# Patient Record
Sex: Female | Born: 1945 | Hispanic: No | Marital: Single | Smoking: Never smoker
Health system: Southern US, Community
[De-identification: ages and names within clinical notes are randomized; demographics above are authoritative.]

## PROBLEM LIST (undated history)

## (undated) DIAGNOSIS — J45909 Unspecified asthma, uncomplicated: Secondary | ICD-10-CM

## (undated) DIAGNOSIS — I10 Essential (primary) hypertension: Secondary | ICD-10-CM

## (undated) HISTORY — PX: JOINT REPLACEMENT: SHX530

---

## 2021-03-10 ENCOUNTER — Emergency Department (HOSPITAL_COMMUNITY)
Admission: EM | Admit: 2021-03-10 | Discharge: 2021-03-10 | Disposition: A | Discharge status: Home / Self Care | Payer: Medicare Other | Attending: Emergency Medicine | Admitting: Emergency Medicine

## 2021-03-10 ENCOUNTER — Encounter (HOSPITAL_COMMUNITY): Payer: Self-pay

## 2021-03-10 ENCOUNTER — Other Ambulatory Visit: Payer: Self-pay

## 2021-03-10 ENCOUNTER — Emergency Department (HOSPITAL_BASED_OUTPATIENT_CLINIC_OR_DEPARTMENT_OTHER)
Admit: 2021-03-10 | Discharge: 2021-03-10 | Disposition: A | Discharge status: Home / Self Care | Payer: Medicare Other | Attending: Emergency Medicine | Admitting: Emergency Medicine

## 2021-03-10 DIAGNOSIS — M7989 Other specified soft tissue disorders: Secondary | ICD-10-CM | POA: Diagnosis not present

## 2021-03-10 DIAGNOSIS — L03116 Cellulitis of left lower limb: Secondary | ICD-10-CM | POA: Diagnosis not present

## 2021-03-10 DIAGNOSIS — I10 Essential (primary) hypertension: Secondary | ICD-10-CM | POA: Insufficient documentation

## 2021-03-10 DIAGNOSIS — M79605 Pain in left leg: Secondary | ICD-10-CM

## 2021-03-10 DIAGNOSIS — J45909 Unspecified asthma, uncomplicated: Secondary | ICD-10-CM | POA: Insufficient documentation

## 2021-03-10 DIAGNOSIS — M79662 Pain in left lower leg: Secondary | ICD-10-CM | POA: Diagnosis present

## 2021-03-10 DIAGNOSIS — Z9660 Presence of unspecified orthopedic joint implant: Secondary | ICD-10-CM | POA: Diagnosis not present

## 2021-03-10 HISTORY — DX: Essential (primary) hypertension: I10

## 2021-03-10 HISTORY — DX: Unspecified asthma, uncomplicated: J45.909

## 2021-03-10 MED ORDER — DOXYCYCLINE HYCLATE 100 MG PO CAPS: 100.0000 mg | ORAL_CAPSULE | Freq: Two times a day (BID) | ORAL | 0 refills | Status: DC

## 2021-03-10 NOTE — Discharge Instructions (Addendum)
I am prescribing you an antibiotic called doxycycline.  I want to take this twice a day for the next 10 days.  Please do not stop taking this early.  Please follow-up with your regular doctor early next week to have your left leg rechecked.  If you develop any new or worsening symptoms please come back to the emergency department immediately for reevaluation.  It was a pleasure to meet you.

## 2021-03-10 NOTE — ED Triage Notes (Signed)
Patient c/o left calf pain since yesterday.

## 2021-03-10 NOTE — Progress Notes (Signed)
Left lower extremity venous duplex has been completed. Preliminary results can be found in CV Proc through chart review.  Results were given to [facility].  03/10/21 9:53 AM Olen Cordial RVT

## 2021-03-10 NOTE — ED Provider Notes (Signed)
[facility]-EMERGENCY DEPT Provider Note   CSN: [id number] Arrival date & time: 03/10/21  6606     History Chief Complaint  Patient presents with   calf pain    Mandy Calhoun is a 75 y.o. female.  HPI Patient is a 75 year old female who presents to the emergency department due to left lower leg pain that started in the past 1 to 2 days.  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  She states that her left leg is not more swollen than normal but has been experiencing intermittent pain in lower extremity.  She states it is circumferential but sometimes is focal in the calf.  No trauma.  No numbness or weakness.  No chest pain or shortness of breath.  She states that she forgot to take her regular medications this morning.    Past Medical History:  Diagnosis Date   Asthma    Hypertension     There are no problems to display for this patient.   Past Surgical History:  Procedure Laterality Date   JOINT REPLACEMENT       OB History   No obstetric history on file.     History reviewed. No pertinent family history.  Social History   Tobacco Use   Smoking status: Never   Smokeless tobacco: Never  Vaping Use   Vaping Use: Never used  Substance Use Topics   Alcohol use: Never   Drug use: Never    Home Medications Prior to Admission medications   Medication Sig Start Date End Date Taking? Authorizing Provider  doxycycline (VIBRAMYCIN) 100 MG capsule Take 1 capsule (100 mg total) by mouth 2 (two) times daily. 03/10/21  Yes Placido Sou, PA-C    Allergies    Patient has no known allergies.  Review of Systems   Review of Systems  All other systems reviewed and are negative. Ten systems reviewed and are negative for acute change, except as noted in the HPI.   Physical Exam Updated Vital Signs BP (!) 141/101 (BP Location: Right Arm)   Pulse 62   Temp 98.3 F (36.8 C) (Oral)   Resp 16   Ht 5'  9" (1.753 m)   Wt 89.8 kg   SpO2 99%   BMI 29.24 kg/m   Physical Exam Vitals and nursing note reviewed.  Constitutional:      General: She is not in acute distress.    Appearance: Normal appearance. She is not ill-appearing, toxic-appearing or diaphoretic.  HENT:     Head: Normocephalic and atraumatic.     Right Ear: External ear normal.     Left Ear: External ear normal.     Nose: Nose normal.     Mouth/Throat:     Mouth: Mucous membranes are moist.     Pharynx: Oropharynx is clear. No oropharyngeal exudate or posterior oropharyngeal erythema.  Eyes:     Extraocular Movements: Extraocular movements intact.  Cardiovascular:     Rate and Rhythm: Normal rate and regular rhythm.     Pulses: Normal pulses.     Heart sounds: Normal heart sounds. No murmur heard.   No friction rub. No gallop.     Comments: Regular rate and rhythm without murmurs, rubs, or gallops. Pulmonary:     Effort: Pulmonary effort is normal. No respiratory distress.     Breath sounds: No stridor. Wheezing present. No rhonchi or rales.     Comments: Faint expiratory wheezes noted bilaterally.  Speaking clearly, coherently, and in complete sentences.  No respiratory distress noted. Abdominal:     General: Abdomen is flat. There is no distension.  Musculoskeletal:        General: Tenderness present. Normal range of motion.     Cervical back: Normal range of motion and neck supple. No tenderness.     Right lower leg: Edema present.     Left lower leg: Edema present.     Comments: 2+ nonpitting edema noted in the right lower extremity up to mid calf.  1+ nonpitting edema noted in the left lower extremity up to mid calf.  Mild erythema noted circumferentially in the left lower leg with mild increased warmth in the leg.  No palpable cords.  No focal tenderness.  Skin:    General: Skin is warm and dry.  Neurological:     General: No focal deficit present.     Mental Status: She is alert and oriented to person,  place, and time.  Psychiatric:        Mood and Affect: Mood normal.        Behavior: Behavior normal.    ED Results / Procedures / Treatments   Labs (all labs ordered are listed, but only abnormal results are displayed) Labs Reviewed - No data to display  EKG None  Radiology VAS US LOWER EXTREMITY VENOUS (DVT) (ONLY [facility] & [facility])  Result Date: 03/10/2021  Lower Venous DVT Study [facility]  Date of Exam:   03/10/2021 Medical Rec #: [id number]   Accession #:    [id number] Date of Birth: December 10, 1945   Patient Gender: F Patient Age:   [age]Y [facility] Procedure:      VAS US LOWER EXTREMITY VENOUS (DVT) Referring Phys: [id number] Placido SouLOGAN Keyanah Kozicki --------------------------------------------------------------------------------  Indications: Pain, and Swelling.  Risk Factors: None identified. Limitations: Body habitus, poor ultrasound/tissue interface and patient positioning. Comparison Study: No prior studies. Performing Technologist: Chanda BusingGregory Collins RVT  Examination Guidelines: A complete evaluation includes B-mode imaging, spectral Doppler, color Doppler, and power Doppler as needed of all accessible portions of each vessel. Bilateral testing is considered an integral part of a complete examination. Limited examinations for reoccurring indications may be performed as noted. The reflux portion of the exam is performed with the patient in reverse Trendelenburg.  +-----+---------------+---------+-----------+----------+--------------+ RIGHTCompressibilityPhasicitySpontaneityPropertiesThrombus Aging +-----+---------------+---------+-----------+----------+--------------+ CFV  Full           Yes      Yes                                 +-----+---------------+---------+-----------+----------+--------------+   +---------+---------------+---------+-----------+----------+--------------+ LEFT     CompressibilityPhasicitySpontaneityPropertiesThrombus Aging  +---------+---------------+---------+-----------+----------+--------------+ CFV      Full           Yes      Yes                                 +---------+---------------+---------+-----------+----------+--------------+ SFJ      Full                                                        +---------+---------------+---------+-----------+----------+--------------+ FV Prox  Full                                                        +---------+---------------+---------+-----------+----------+--------------+  FV Mid   Full                                                        +---------+---------------+---------+-----------+----------+--------------+ FV Distal               Yes      Yes                                 +---------+---------------+---------+-----------+----------+--------------+ PFV      Full                                                        +---------+---------------+---------+-----------+----------+--------------+ POP      Full           Yes      Yes                                 +---------+---------------+---------+-----------+----------+--------------+ PTV      Full                                                        +---------+---------------+---------+-----------+----------+--------------+ PERO     Full                                                        +---------+---------------+---------+-----------+----------+--------------+    Summary: RIGHT: - No evidence of common femoral vein obstruction.  LEFT: - There is no evidence of deep vein thrombosis in the lower extremity. However, portions of this examination were limited- see technologist comments above.  - No cystic structure found in the popliteal fossa.  *See table(s) above for measurements and observations.    Preliminary     Procedures Procedures   Medications Ordered in ED Medications - No data to display  ED Course  I have reviewed the triage vital signs  and the nursing notes.  Pertinent labs & imaging results that were available during my care of the patient were reviewed by me and considered in my medical decision making (see chart for details).    MDM Rules/Calculators/A&P                          Patient is a 75 year old female with a history of chronic lower extremity edema who presents to the emergency department with left lower leg pain that started yesterday.  Obtained an ultrasound in the emergency department which was negative for DVT in the left lower extremity.  Patient has no focal tenderness on my exam but appears to be developing a mild erythema circumferentially in the left lower leg with increased warmth and I am concerned that she is possibly developing an early cellulitis.  Feel that it is reasonable to discharge patient on an antibiotic and she is agreeable.    Recommended patient return to the emergency department over the weekend with any new or worsening symptoms.  Otherwise, follow-up with her regular doctor early next week to have her leg rechecked.  She verbalized understanding of this plan and was agreeable.  Her questions were answered and she was amicable at the time of discharge.  Final Clinical Impression(s) / ED Diagnoses Final diagnoses:  Cellulitis of left lower extremity   Rx / DC Orders ED Discharge Orders          Ordered    doxycycline (VIBRAMYCIN) 100 MG capsule  2 times daily        03/10/21 1018             Placido Sou, PA-C 03/10/21 1030    Alvira Monday, MD 03/10/21 2148

## 2021-07-02 ENCOUNTER — Emergency Department (HOSPITAL_COMMUNITY)
Admission: EM | Admit: 2021-07-02 | Discharge: 2021-07-02 | Disposition: A | Discharge status: Home / Self Care | Payer: Medicare Other | Attending: Emergency Medicine | Admitting: Emergency Medicine

## 2021-07-02 ENCOUNTER — Encounter (HOSPITAL_COMMUNITY): Payer: Self-pay | Admitting: Emergency Medicine

## 2021-07-02 ENCOUNTER — Emergency Department (HOSPITAL_BASED_OUTPATIENT_CLINIC_OR_DEPARTMENT_OTHER)
Admission: EM | Admit: 2021-07-02 | Discharge: 2021-07-02 | Disposition: A | Discharge status: Home / Self Care | Payer: Medicare Other | Source: Home / Self Care | Attending: Emergency Medicine | Admitting: Emergency Medicine

## 2021-07-02 ENCOUNTER — Emergency Department (HOSPITAL_COMMUNITY): Payer: Medicare Other

## 2021-07-02 ENCOUNTER — Other Ambulatory Visit: Payer: Self-pay

## 2021-07-02 DIAGNOSIS — R609 Edema, unspecified: Secondary | ICD-10-CM | POA: Diagnosis not present

## 2021-07-02 DIAGNOSIS — Z9660 Presence of unspecified orthopedic joint implant: Secondary | ICD-10-CM | POA: Insufficient documentation

## 2021-07-02 DIAGNOSIS — J4521 Mild intermittent asthma with (acute) exacerbation: Secondary | ICD-10-CM | POA: Diagnosis not present

## 2021-07-02 DIAGNOSIS — L03116 Cellulitis of left lower limb: Secondary | ICD-10-CM | POA: Insufficient documentation

## 2021-07-02 DIAGNOSIS — R600 Localized edema: Secondary | ICD-10-CM | POA: Diagnosis not present

## 2021-07-02 DIAGNOSIS — I10 Essential (primary) hypertension: Secondary | ICD-10-CM | POA: Insufficient documentation

## 2021-07-02 DIAGNOSIS — M7989 Other specified soft tissue disorders: Secondary | ICD-10-CM | POA: Diagnosis present

## 2021-07-02 LAB — CBC WITH DIFFERENTIAL/PLATELET
Abs Immature Granulocytes: 0.02 10*3/uL (ref 0.00–0.07)
Basophils Absolute: 0.1 10*3/uL (ref 0.0–0.1)
Basophils Relative: 1 %
Eosinophils Absolute: 0.6 10*3/uL — ABNORMAL HIGH (ref 0.0–0.5)
Eosinophils Relative: 8 %
HCT: 39.8 % (ref 36.0–46.0)
Hemoglobin: 13.3 g/dL (ref 12.0–15.0)
Immature Granulocytes: 0 %
Lymphocytes Relative: 16 %
Lymphs Abs: 1.2 10*3/uL (ref 0.7–4.0)
MCH: 32.5 pg (ref 26.0–34.0)
MCHC: 33.4 g/dL (ref 30.0–36.0)
MCV: 97.3 fL (ref 80.0–100.0)
Monocytes Absolute: 0.6 10*3/uL (ref 0.1–1.0)
Monocytes Relative: 8 %
Neutro Abs: 5.2 10*3/uL (ref 1.7–7.7)
Neutrophils Relative %: 67 %
Platelets: 225 10*3/uL (ref 150–400)
RBC: 4.09 MIL/uL (ref 3.87–5.11)
RDW: 12.2 % (ref 11.5–15.5)
WBC: 7.8 10*3/uL (ref 4.0–10.5)
nRBC: 0 % (ref 0.0–0.2)

## 2021-07-02 LAB — BASIC METABOLIC PANEL
Anion gap: 6 (ref 5–15)
BUN: 15 mg/dL (ref 8–23)
CO2: 28 mmol/L (ref 22–32)
Calcium: 9.2 mg/dL (ref 8.9–10.3)
Chloride: 98 mmol/L (ref 98–111)
Creatinine, Ser: 0.61 mg/dL (ref 0.44–1.00)
GFR, Estimated: >60 mL/min (ref >60)
Glucose, Bld: 106 mg/dL — ABNORMAL HIGH (ref 70–99)
Potassium: 3.4 mmol/L — ABNORMAL LOW (ref 3.5–5.1)
Sodium: 132 mmol/L — ABNORMAL LOW (ref 135–145)

## 2021-07-02 LAB — BRAIN NATRIURETIC PEPTIDE: B Natriuretic Peptide: 29 pg/mL (ref 0.0–100.0)

## 2021-07-02 MED ORDER — CEPHALEXIN 500 MG PO CAPS: 500.0000 mg | ORAL_CAPSULE | Freq: Once | ORAL | Status: DC

## 2021-07-02 MED ORDER — PREDNISONE 20 MG PO TABS: 40.0000 mg | ORAL_TABLET | Freq: Every day | ORAL | 0 refills | Status: DC

## 2021-07-02 MED ORDER — CEPHALEXIN 500 MG PO CAPS: 500.0000 mg | ORAL_CAPSULE | Freq: Four times a day (QID) | ORAL | 0 refills | Status: DC

## 2021-07-02 NOTE — ED Triage Notes (Signed)
Pt arrives POV w/ c/o bilateral leg swelling that began this week. Pt takes lasix, but said this issue has happened before. Leg pain 8/10 Hx blood clot.

## 2021-07-02 NOTE — Discharge Instructions (Signed)
We saw in the ER for leg pain and some asthma complaints.  For your asthma, take prednisone and continue to give yourself breathing treatment every 2-4 hours as needed.  For the leg, there is no evidence of blood clot.  You do have cellulitis for which antibiotics have been prescribed.

## 2021-07-02 NOTE — Progress Notes (Signed)
Left lower extremity venous duplex has been completed. Preliminary results can be found in CV Proc through chart review.  Results were given to Dr. Rhunette Croft.  07/02/21 12:59 PM Olen Cordial RVT

## 2021-07-02 NOTE — ED Provider Notes (Signed)
[facility]   CSN: [id number] Arrival date & time: 07/02/21  1100     History No chief complaint on file.   Mandy Calhoun is a 75 y.o. female.  HPI    75 year old female with history of asthma and hypertension comes in with chief complaint of bilateral leg swelling and pain.  She reports that her left leg is painful.  She has had blood clots in the past.  She is taking Lasix as prescribed.  In addition to the leg symptoms, patient also reports having some cough and asthma-like symptoms.  She denies any severe chest pain, shortness of breath, fevers, chills, body aches and denies any sick contacts.  Past medical history of hypertension and asthma.  Patient was seen in the ER earlier in the year and had ultrasound DVT, which was negative but the left side was not an adequate study.  Past Medical History:  Diagnosis Date   Asthma    Hypertension     There are no problems to display for this patient.   Past Surgical History:  Procedure Laterality Date   JOINT REPLACEMENT       OB History   No obstetric history on file.     History reviewed. No pertinent family history.  Social History   Tobacco Use   Smoking status: Never   Smokeless tobacco: Never  Vaping Use   Vaping Use: Never used  Substance Use Topics   Alcohol use: Never   Drug use: Never    Home Medications Prior to Admission medications   Medication Sig Start Date End Date Taking? Authorizing Provider  cephALEXin (KEFLEX) 500 MG capsule Take 1 capsule (500 mg total) by mouth 4 (four) times daily. 07/02/21  Yes Lawson Mahone, MD  predniSONE (DELTASONE) 20 MG tablet Take 2 tablets (40 mg total) by mouth daily. 07/02/21  Yes Derwood Kaplan, MD  doxycycline (VIBRAMYCIN) 100 MG capsule Take 1 capsule (100 mg total) by mouth 2 (two) times daily. 03/10/21   Placido Sou, PA-C    Allergies    Patient has no known allergies.  Review of Systems   Review  of Systems  Constitutional:  Positive for activity change.  Respiratory:  Positive for cough and wheezing. Negative for shortness of breath.   Cardiovascular:  Negative for chest pain.  Gastrointestinal:  Negative for abdominal pain.  Musculoskeletal:  Positive for myalgias.  Allergic/Immunologic: Negative for immunocompromised state.  Hematological:  Does not bruise/bleed easily.  All other systems reviewed and are negative.  Physical Exam Updated Vital Signs BP (!) 196/91   Pulse 86   Temp (!) 97.5 F (36.4 C) (Oral)   Resp (!) 31   SpO2 93%   Physical Exam Vitals and nursing note reviewed.  Constitutional:      Appearance: She is well-developed.  HENT:     Head: Atraumatic.  Cardiovascular:     Rate and Rhythm: Normal rate.  Pulmonary:     Effort: Pulmonary effort is normal.     Breath sounds: Wheezing present.  Musculoskeletal:     Cervical back: Normal range of motion and neck supple.     Right lower leg: Edema present.     Left lower leg: Edema present.     Comments: LLE has erythema and warmth to touch  Skin:    General: Skin is warm and dry.  Neurological:     Mental Status: She is alert and oriented to person, place, and time.  ED Results / Procedures / Treatments   Labs (all labs ordered are listed, but only abnormal results are displayed) Labs Reviewed  CBC WITH DIFFERENTIAL/PLATELET - Abnormal; Notable for the following components:      Result Value   Eosinophils Absolute 0.6 (*)    All other components within normal limits  BASIC METABOLIC PANEL - Abnormal; Notable for the following components:   Sodium 132 (*)    Potassium 3.4 (*)    Glucose, Bld 106 (*)    All other components within normal limits  BRAIN NATRIURETIC PEPTIDE    EKG None  Radiology DG Chest Port 1 View  Result Date: 07/02/2021 CLINICAL DATA:  Bilateral lower extremity edema. EXAM: PORTABLE CHEST 1 VIEW COMPARISON:  12/10/2020 from [facility] FINDINGS: Patient  moderately rotated left. Mild cardiomegaly. Atherosclerosis in the transverse aorta. Small hiatal hernia. No pleural effusion or pneumothorax. No congestive failure. Left upper lobe calcified granuloma versus bone island within the fourth posterolateral left rib. IMPRESSION: Cardiomegaly, without congestive failure. Aortic Atherosclerosis (ICD10-I70.0). Similar small hiatal hernia. Electronically Signed   By: Jeronimo Greaves M.D.   On: 07/02/2021 14:31   VAS [place] LOWER EXTREMITY VENOUS (DVT) (7a-7p)  Result Date: 07/02/2021  Lower Venous DVT Study Patient Name:  [facility]  Date of Exam:   07/02/2021 Medical Rec #: [id number]   Accession #:    [id number] Date of Birth: [birth year]   Patient Gender: F Patient Age:   89 years Exam Location:  [facility] Procedure:      VAS [place] LOWER EXTREMITY VENOUS (DVT) Referring Phys: Janey Genta Kevron Patella --------------------------------------------------------------------------------  Indications: Edema.  Risk Factors: None identified. Limitations: Body habitus and poor ultrasound/tissue interface. Comparison Study: No prior studies. Performing Technologist: Chanda Busing RVT  Examination Guidelines: A complete evaluation includes B-mode imaging, spectral Doppler, color Doppler, and power Doppler as needed of all accessible portions of each vessel. Bilateral testing is considered an integral part of a complete examination. Limited examinations for reoccurring indications may be performed as noted. The reflux portion of the exam is performed with the patient in reverse Trendelenburg.  +-----+---------------+---------+-----------+----------+--------------+ RIGHTCompressibilityPhasicitySpontaneityPropertiesThrombus Aging +-----+---------------+---------+-----------+----------+--------------+ CFV  Full           Yes      Yes                                 +-----+---------------+---------+-----------+----------+--------------+    +---------+---------------+---------+-----------+----------+--------------+ LEFT     CompressibilityPhasicitySpontaneityPropertiesThrombus Aging +---------+---------------+---------+-----------+----------+--------------+ CFV      Full           Yes      Yes                                 +---------+---------------+---------+-----------+----------+--------------+ SFJ      Full                                                        +---------+---------------+---------+-----------+----------+--------------+ FV Prox  Full                                                        +---------+---------------+---------+-----------+----------+--------------+  FV Mid   Full                                                        +---------+---------------+---------+-----------+----------+--------------+ FV Distal               Yes      Yes                                 +---------+---------------+---------+-----------+----------+--------------+ PFV      Full                                                        +---------+---------------+---------+-----------+----------+--------------+ POP      Full           Yes      Yes                                 +---------+---------------+---------+-----------+----------+--------------+ PTV      Full                                                        +---------+---------------+---------+-----------+----------+--------------+ PERO     Full                                                        +---------+---------------+---------+-----------+----------+--------------+    Summary: RIGHT: - No evidence of common femoral vein obstruction.  LEFT: - There is no evidence of deep vein thrombosis in the lower extremity. However, portions of this examination were limited- see technologist comments above.  - No cystic structure found in the popliteal fossa.  *See table(s) above for measurements and observations.     Preliminary     Procedures Procedures   Medications Ordered in ED Medications  cephALEXin (KEFLEX) capsule 500 mg (has no administration in time range)    ED Course  I have reviewed the triage vital signs and the nursing notes.  Pertinent labs & imaging results that were available during my care of the patient were reviewed by me and considered in my medical decision making (see chart for details).  Clinical Course as of 07/02/21 1450  Sun Jul 02, 2021  1450 DG Chest [place] 1 View X-rays independently reviewed by me. [AN]    Clinical Course User Index [AN] Derwood Kaplan, MD   MDM Rules/Calculators/A&P                           Pt comes in with cc of leg swelling and pain.  The leg is erythematous and warm to touch.  Patient had DVT study back in June, however the left lower extremity ultrasound was not adequate.  I  have repeated ultrasound again to ensure there is no DVT.  If the DVT study is negative then we will treat this like cellulitis.  Other possibilities include thrombophlebitis that is superficial.  Patient is also complaining of some cough and is noted to be having mild wheezing.  She has brought her own breathing treatments which she has taken and on reassessment feels a lot better.  X-ray is reassuring.  We will give her 3 days of prednisone.    Final Clinical Impression(s) / ED Diagnoses Final diagnoses:  Cellulitis of left lower extremity  Mild intermittent asthma with acute exacerbation    Rx / DC Orders ED Discharge Orders          Ordered    cephALEXin (KEFLEX) 500 MG capsule  4 times daily        07/02/21 1447    predniSONE (DELTASONE) 20 MG tablet  Daily        07/02/21 1447             Derwood Kaplan, MD 07/02/21 1450

## 2021-10-10 ENCOUNTER — Emergency Department (HOSPITAL_COMMUNITY)
Admission: EM | Admit: 2021-10-10 | Discharge: 2021-10-11 | Disposition: A | Discharge status: Home / Self Care | Payer: Medicare Other | Attending: Emergency Medicine | Admitting: Emergency Medicine

## 2021-10-10 ENCOUNTER — Encounter (HOSPITAL_COMMUNITY): Payer: Self-pay | Admitting: Emergency Medicine

## 2021-10-10 DIAGNOSIS — R2242 Localized swelling, mass and lump, left lower limb: Secondary | ICD-10-CM | POA: Insufficient documentation

## 2021-10-10 DIAGNOSIS — Z5321 Procedure and treatment not carried out due to patient leaving prior to being seen by health care provider: Secondary | ICD-10-CM | POA: Diagnosis not present

## 2021-10-10 NOTE — ED Notes (Addendum)
Pt refuses to come back for MSE. Reports she will not be seen by PA only MD

## 2021-10-10 NOTE — ED Notes (Signed)
Called for vitals x4 ?

## 2021-10-10 NOTE — ED Triage Notes (Addendum)
Pt presents to ED Pov. Pt c/o LE swelling. Pt reports swelling began a few weeks ago and now blisters began 2d ago. Pt reports taking lasix compliantly. Denies SOB

## 2021-12-20 ENCOUNTER — Other Ambulatory Visit (HOSPITAL_COMMUNITY): Payer: Self-pay

## 2021-12-20 ENCOUNTER — Encounter (HOSPITAL_COMMUNITY): Payer: Self-pay

## 2021-12-20 ENCOUNTER — Emergency Department (HOSPITAL_COMMUNITY)
Admission: EM | Admit: 2021-12-20 | Discharge: 2021-12-20 | Disposition: A | Discharge status: Home / Self Care | Payer: Medicare Other | Attending: Emergency Medicine | Admitting: Emergency Medicine

## 2021-12-20 ENCOUNTER — Emergency Department (HOSPITAL_BASED_OUTPATIENT_CLINIC_OR_DEPARTMENT_OTHER)
Admit: 2021-12-20 | Discharge: 2021-12-20 | Disposition: A | Discharge status: Home / Self Care | Payer: Medicare Other | Attending: Emergency Medicine | Admitting: Emergency Medicine

## 2021-12-20 ENCOUNTER — Other Ambulatory Visit: Payer: Self-pay

## 2021-12-20 DIAGNOSIS — D649 Anemia, unspecified: Secondary | ICD-10-CM | POA: Diagnosis not present

## 2021-12-20 DIAGNOSIS — M7989 Other specified soft tissue disorders: Secondary | ICD-10-CM | POA: Diagnosis present

## 2021-12-20 DIAGNOSIS — I10 Essential (primary) hypertension: Secondary | ICD-10-CM | POA: Insufficient documentation

## 2021-12-20 DIAGNOSIS — E876 Hypokalemia: Secondary | ICD-10-CM | POA: Insufficient documentation

## 2021-12-20 DIAGNOSIS — J45909 Unspecified asthma, uncomplicated: Secondary | ICD-10-CM | POA: Diagnosis not present

## 2021-12-20 DIAGNOSIS — Z79899 Other long term (current) drug therapy: Secondary | ICD-10-CM | POA: Insufficient documentation

## 2021-12-20 DIAGNOSIS — L03115 Cellulitis of right lower limb: Secondary | ICD-10-CM | POA: Diagnosis not present

## 2021-12-20 DIAGNOSIS — I890 Lymphedema, not elsewhere classified: Secondary | ICD-10-CM | POA: Insufficient documentation

## 2021-12-20 DIAGNOSIS — L03119 Cellulitis of unspecified part of limb: Secondary | ICD-10-CM

## 2021-12-20 LAB — CBC WITH DIFFERENTIAL/PLATELET
Abs Immature Granulocytes: 0.01 10*3/uL (ref 0.00–0.07)
Basophils Absolute: 0.1 10*3/uL (ref 0.0–0.1)
Basophils Relative: 1 %
Eosinophils Absolute: 0.2 10*3/uL (ref 0.0–0.5)
Eosinophils Relative: 3 %
HCT: 33.9 % — ABNORMAL LOW (ref 36.0–46.0)
Hemoglobin: 11.3 g/dL — ABNORMAL LOW (ref 12.0–15.0)
Immature Granulocytes: 0 %
Lymphocytes Relative: 20 %
Lymphs Abs: 1 10*3/uL (ref 0.7–4.0)
MCH: 32.3 pg (ref 26.0–34.0)
MCHC: 33.3 g/dL (ref 30.0–36.0)
MCV: 96.9 fL (ref 80.0–100.0)
Monocytes Absolute: 0.4 10*3/uL (ref 0.1–1.0)
Monocytes Relative: 8 %
Neutro Abs: 3.7 10*3/uL (ref 1.7–7.7)
Neutrophils Relative %: 68 %
Platelets: 199 10*3/uL (ref 150–400)
RBC: 3.5 MIL/uL — ABNORMAL LOW (ref 3.87–5.11)
RDW: 11.9 % (ref 11.5–15.5)
WBC: 5.3 10*3/uL (ref 4.0–10.5)
nRBC: 0 % (ref 0.0–0.2)

## 2021-12-20 LAB — BASIC METABOLIC PANEL
Anion gap: 4 — ABNORMAL LOW (ref 5–15)
BUN: 14 mg/dL (ref 8–23)
CO2: 30 mmol/L (ref 22–32)
Calcium: 8.7 mg/dL — ABNORMAL LOW (ref 8.9–10.3)
Chloride: 103 mmol/L (ref 98–111)
Creatinine, Ser: 0.48 mg/dL (ref 0.44–1.00)
GFR, Estimated: >60 mL/min (ref >60)
Glucose, Bld: 96 mg/dL (ref 70–99)
Potassium: 3.3 mmol/L — ABNORMAL LOW (ref 3.5–5.1)
Sodium: 137 mmol/L (ref 135–145)

## 2021-12-20 MED ORDER — DOXYCYCLINE HYCLATE 100 MG PO CAPS
100.0000 mg | ORAL_CAPSULE | Freq: Two times a day (BID) | ORAL | 0 refills | Status: AC
  Filled 2021-12-20: qty 14, 7d supply, fill #0

## 2021-12-20 MED ORDER — POTASSIUM CHLORIDE ER 10 MEQ PO TBCR
10.0000 meq | EXTENDED_RELEASE_TABLET | Freq: Every day | ORAL | 0 refills | Status: AC
  Filled 2021-12-20: qty 5, 5d supply, fill #0

## 2021-12-20 NOTE — ED Triage Notes (Signed)
Pt c/o leg and feet swelling that patient reports has been going on for "a while." Pt reports that they decided to come in today because they have been having increased pain in those areas.  ?

## 2021-12-20 NOTE — Progress Notes (Signed)
Bilateral lower extremity venous duplex has been completed. ?Preliminary results can be found in CV Proc through chart review.  ?Results were given to Dr. Armandina Gemma. ? ?12/20/21 9:58 AM ?Carlos Levering RVT   ?

## 2021-12-20 NOTE — ED Provider Notes (Addendum)
[facility]-EMERGENCY DEPT Provider Note   CSN: [id number] Arrival date & time: 12/20/21  8657     History  Chief Complaint  Patient presents with   Leg Swelling    Mandy Calhoun is a 76 y.o. female.  HPI  76 year old female with medical history significant for hypertension, asthma, chronic lymphedema who presents to the emergency department with worsening bilateral leg swelling and pain.  The patient states that over the past week, she has noticed worsening swelling in her feet bilaterally with associating increasing weeping sores along her right lower extremity and redness and pain.  She denies any fevers or chills.  She states that she is followed outpatient for her lymphedema and her PCP has tried prescribing furosemide "but it did not do anything for me."  Home Medications Prior to Admission medications   Medication Sig Start Date End Date Taking? Authorizing Provider  doxycycline (VIBRAMYCIN) 100 MG capsule Take 1 capsule by mouth 2 times daily for 7 days. 12/20/21 12/27/21 Yes Ernie Avena, MD  potassium chloride (KLOR-CON) 10 MEQ tablet Take 1 tablet (10 mEq total) by mouth daily for 5 days. 12/20/21 12/25/21 Yes Ernie Avena, MD      Allergies    Patient has no known allergies.    Review of Systems   Review of Systems  Cardiovascular:  Positive for leg swelling.  Skin:  Positive for color change.  All other systems reviewed and are negative.  Physical Exam Updated Vital Signs BP (!) 156/72   Pulse 66   Temp (!) 97.5 F (36.4 C) (Oral)   Resp 18   Ht 5\' 9"  (1.753 m)   Wt 86.2 kg   SpO2 93%   BMI 28.06 kg/m  Physical Exam Vitals and nursing note reviewed.  Constitutional:      General: She is not in acute distress. HENT:     Head: Normocephalic and atraumatic.  Eyes:     Conjunctiva/sclera: Conjunctivae normal.     Pupils: Pupils are equal, round, and reactive to light.  Cardiovascular:     Rate and Rhythm: Normal rate and regular  rhythm.     Pulses: Normal pulses.     Comments: 1+ DP pulses bilaterally Pulmonary:     Effort: Pulmonary effort is normal. No respiratory distress.  Abdominal:     General: There is no distension.     Tenderness: There is no guarding.  Musculoskeletal:        General: No signs of injury.     Cervical back: Neck supple.     Right lower leg: Edema present.     Left lower leg: Edema present.     Comments: Chronic appearing bilateral lower extremity lymphedema with pitting edema. Some erythema and weeping of the right lower extremity concerning for developing skin breakdown and cellulitis.  Skin:    Findings: No lesion or rash.  Neurological:     General: No focal deficit present.     Mental Status: She is alert. Mental status is at baseline.    ED Results / Procedures / Treatments   Labs (all labs ordered are listed, but only abnormal results are displayed) Labs Reviewed  CBC WITH DIFFERENTIAL/PLATELET - Abnormal; Notable for the following components:      Result Value   RBC 3.50 (*)    Hemoglobin 11.3 (*)    HCT 33.9 (*)    All other components within normal limits  BASIC METABOLIC PANEL - Abnormal; Notable for the following components:  Potassium 3.3 (*)    Calcium 8.7 (*)    Anion gap 4 (*)    All other components within normal limits    EKG None  Radiology VAS [place] LOWER EXTREMITY VENOUS (DVT) (ONLY [facility] & [facility])  Result Date: 12/20/2021  Lower Venous DVT Study Patient Name:  [name]  Date of Exam:   12/20/2021 Medical Rec #: [id number]   Accession #:    [id number] Date of Birth: 08-27-1946   Patient Gender: F Patient Age:   18 years Exam Location:  [facility] Procedure:      VAS [place] LOWER EXTREMITY VENOUS (DVT) Referring Phys: Ernie Avena --------------------------------------------------------------------------------  Indications: Swelling.  Risk Factors: None identified. Limitations: Body habitus and poor ultrasound/tissue interface. Comparison Study: No prior  studies. Performing Technologist: Chanda Busing RVT  Examination Guidelines: A complete evaluation includes B-mode imaging, spectral Doppler, color Doppler, and power Doppler as needed of all accessible portions of each vessel. Bilateral testing is considered an integral part of a complete examination. Limited examinations for reoccurring indications may be performed as noted. The reflux portion of the exam is performed with the patient in reverse Trendelenburg.  +---------+---------------+---------+-----------+----------+-------------------+ RIGHT    CompressibilityPhasicitySpontaneityPropertiesThrombus Aging      +---------+---------------+---------+-----------+----------+-------------------+ CFV      Full           Yes      Yes                                      +---------+---------------+---------+-----------+----------+-------------------+ SFJ      Full                                                             +---------+---------------+---------+-----------+----------+-------------------+ FV Prox  Full                                                             +---------+---------------+---------+-----------+----------+-------------------+ FV Mid   Full                                                             +---------+---------------+---------+-----------+----------+-------------------+ FV DistalFull                                                             +---------+---------------+---------+-----------+----------+-------------------+ PFV      Full                                                             +---------+---------------+---------+-----------+----------+-------------------+  POP      Full           Yes      Yes                                      +---------+---------------+---------+-----------+----------+-------------------+ PTV      Full                                                              +---------+---------------+---------+-----------+----------+-------------------+ PERO                                                  Not well visualized +---------+---------------+---------+-----------+----------+-------------------+   +---------+---------------+---------+-----------+----------+--------------+ LEFT     CompressibilityPhasicitySpontaneityPropertiesThrombus Aging +---------+---------------+---------+-----------+----------+--------------+ CFV      Full           Yes      Yes                                 +---------+---------------+---------+-----------+----------+--------------+ SFJ      Full                                                        +---------+---------------+---------+-----------+----------+--------------+ FV Prox  Full                                                        +---------+---------------+---------+-----------+----------+--------------+ FV Mid   Full                                                        +---------+---------------+---------+-----------+----------+--------------+ FV DistalFull                                                        +---------+---------------+---------+-----------+----------+--------------+ PFV      Full                                                        +---------+---------------+---------+-----------+----------+--------------+ POP      Full           Yes      Yes                                 +---------+---------------+---------+-----------+----------+--------------+  PTV      Full                                                        +---------+---------------+---------+-----------+----------+--------------+ PERO     Full                                                        +---------+---------------+---------+-----------+----------+--------------+    Summary: RIGHT: - There is no evidence of deep vein thrombosis in the lower extremity. However, portions  of this examination were limited- see technologist comments above.  - No cystic structure found in the popliteal fossa.  LEFT: - There is no evidence of deep vein thrombosis in the lower extremity. However, portions of this examination were limited- see technologist comments above.  - No cystic structure found in the popliteal fossa.  *See table(s) above for measurements and observations.    Preliminary     Procedures Procedures    Medications Ordered in ED Medications - No data to display  ED Course/ Medical Decision Making/ A&P                           Medical Decision Making Amount and/or Complexity of Data Reviewed Labs: ordered.  Risk Prescription drug management.   76 year old female with medical history significant for hypertension, asthma, chronic lymphedema who presents to the emergency department with worsening bilateral leg swelling and pain.  The patient states that over the past week, she has noticed worsening swelling in her feet bilaterally with associating increasing weeping sores along her right lower extremity and redness and pain.  She denies any fevers or chills.  She states that she is followed outpatient for her lymphedema and her PCP has tried prescribing furosemide "but it did not do anything for me."  On arrival, the patient was vitally stable.  Physical exam significant for bilateral chronic appearing lymphedema with some skin breakdown in the right lower extremity with developing areas of erythema concerning for developing cellulitis.  1+ DP pulses bilaterally.  DVT ultrasound was performed and was negative for DVT bilaterally on preliminary report.  Laboratory work-up significant for mild hypokalemia to 3.3, otherwise unremarkable, CBC without a leukocytosis, mild anemia to 11.3.  Advised compression stockings for the patient's lymphedema, continued outpatient diuretic use and outpatient follow-up for chronic management.  We will treat for cellulitis with  doxycycline orally.  Advised close PCP follow-up and lower extremity wound rechecks and lab rechecks of her potassium.    Final Clinical Impression(s) / ED Diagnoses Final diagnoses:  Lymphedema  Cellulitis of lower extremity, unspecified laterality  Hypokalemia    Rx / DC Orders ED Discharge Orders          Ordered    doxycycline (VIBRAMYCIN) 100 MG capsule  2 times daily        12/20/21 0931    potassium chloride (KLOR-CON) 10 MEQ tablet  Daily        12/20/21 1103              Ernie Avena, MD 12/20/21 1057    Ernie Avena, MD 12/20/21 1104

## 2021-12-20 NOTE — Discharge Instructions (Addendum)
You have been evaluated for blood clots in your lower extremities and the results are negative. Your symptoms are consistent with lymphedema, likely with superimposed cellulitis. Cellulitis is a bacterial infection of the legs. Recommend taking Doxycycline and following up with your PCP to discuss further chronic management. Compression stockings can also help the swelling. ?

## 2023-02-05 IMAGING — DX DG CHEST 1V PORT
1 series · 1 of 1 positions shown · non-contrast
Comparison: 12/10/2020 from [facility]

CLINICAL DATA: Bilateral lower extremity edema.

EXAM:
PORTABLE CHEST 1 VIEW

[chest ap]
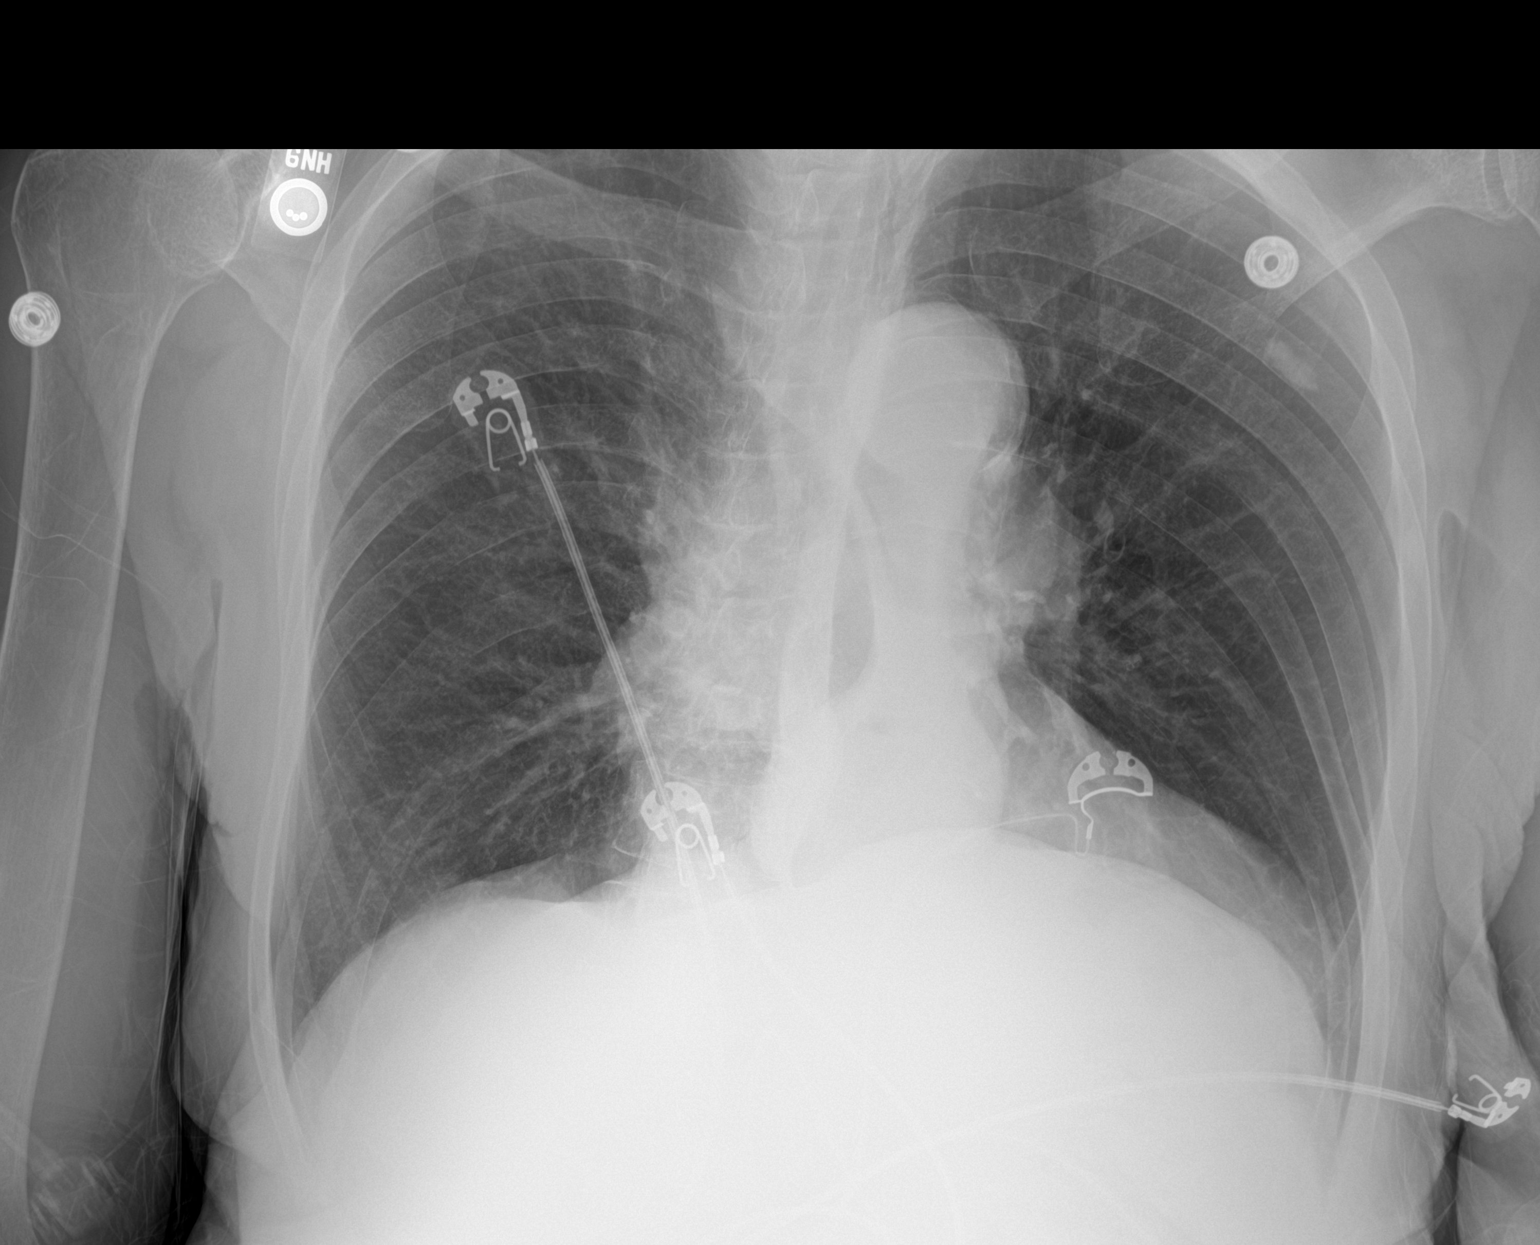

[1 of 1 positions shown; findings below may reference images not displayed]

FINDINGS: Patient moderately rotated left. Mild cardiomegaly. Atherosclerosis
in the transverse aorta. Small hiatal hernia. No pleural effusion or
pneumothorax. No congestive failure. Left upper lobe calcified
granuloma versus bone island within the fourth posterolateral left
rib.
IMPRESSION: Cardiomegaly, without congestive failure.

Aortic Atherosclerosis ([id number].H).

Similar small hiatal hernia.

## 2023-08-09 ENCOUNTER — Emergency Department (HOSPITAL_COMMUNITY)
Admission: EM | Admit: 2023-08-09 | Discharge: 2023-08-09 | Disposition: A | Discharge status: Home / Self Care | Payer: Medicare Other | Attending: Emergency Medicine | Admitting: Emergency Medicine

## 2023-08-09 ENCOUNTER — Emergency Department (HOSPITAL_COMMUNITY): Payer: Medicare Other

## 2023-08-09 ENCOUNTER — Other Ambulatory Visit: Payer: Self-pay

## 2023-08-09 DIAGNOSIS — R600 Localized edema: Secondary | ICD-10-CM | POA: Diagnosis present

## 2023-08-09 DIAGNOSIS — I10 Essential (primary) hypertension: Secondary | ICD-10-CM | POA: Insufficient documentation

## 2023-08-09 DIAGNOSIS — I890 Lymphedema, not elsewhere classified: Secondary | ICD-10-CM | POA: Insufficient documentation

## 2023-08-09 DIAGNOSIS — E876 Hypokalemia: Secondary | ICD-10-CM | POA: Insufficient documentation

## 2023-08-09 DIAGNOSIS — J45909 Unspecified asthma, uncomplicated: Secondary | ICD-10-CM | POA: Insufficient documentation

## 2023-08-09 DIAGNOSIS — L03115 Cellulitis of right lower limb: Secondary | ICD-10-CM | POA: Diagnosis not present

## 2023-08-09 LAB — BASIC METABOLIC PANEL
Anion gap: 7 (ref 5–15)
BUN: 12 mg/dL (ref 8–23)
CO2: 28 mmol/L (ref 22–32)
Calcium: 8.9 mg/dL (ref 8.9–10.3)
Chloride: 103 mmol/L (ref 98–111)
Creatinine, Ser: 0.62 mg/dL (ref 0.44–1.00)
GFR, Estimated: >60 mL/min (ref >60)
Glucose, Bld: 100 mg/dL — ABNORMAL HIGH (ref 70–99)
Potassium: 3.3 mmol/L — ABNORMAL LOW (ref 3.5–5.1)
Sodium: 138 mmol/L (ref 135–145)

## 2023-08-09 LAB — CBC
HCT: 36.3 % (ref 36.0–46.0)
Hemoglobin: 12 g/dL (ref 12.0–15.0)
MCH: 32.4 pg (ref 26.0–34.0)
MCHC: 33.1 g/dL (ref 30.0–36.0)
MCV: 98.1 fL (ref 80.0–100.0)
Platelets: 185 10*3/uL (ref 150–400)
RBC: 3.7 MIL/uL — ABNORMAL LOW (ref 3.87–5.11)
RDW: 12.2 % (ref 11.5–15.5)
WBC: 5.2 10*3/uL (ref 4.0–10.5)
nRBC: 0 % (ref 0.0–0.2)

## 2023-08-09 LAB — BRAIN NATRIURETIC PEPTIDE: B Natriuretic Peptide: 130 pg/mL — ABNORMAL HIGH (ref 0.0–100.0)

## 2023-08-09 LAB — I-STAT CG4 LACTIC ACID, ED: Lactic Acid, Venous: 1.3 mmol/L (ref 0.5–1.9)

## 2023-08-09 MED ORDER — CEPHALEXIN 500 MG PO CAPS
500.0000 mg | ORAL_CAPSULE | Freq: Once | ORAL | Status: AC
Start: 2023-08-09 — End: 2023-08-09
  Administered 2023-08-09: 500 mg via ORAL
  Filled 2023-08-09: qty 1

## 2023-08-09 NOTE — Discharge Instructions (Addendum)
Take the antibiotics as prescribed.  Apply a sterile dressing over the wound to help with the drainage and change it daily.  Follow-up with your primary care doctor and consider seeing the wound care center to help with your lymphedema

## 2023-08-09 NOTE — ED Provider Notes (Addendum)
[place] EMERGENCY DEPARTMENT AT [facility] Provider Note   CSN: [id number] Arrival date & time: 08/09/23  1044     History  Chief complaint: Leg swelling  Mandy Calhoun is a 77 y.o. female.  HPI   Patient has history of asthma, hypertension, chronic lymphedema.  She presents to the ED with complaints of increasing leg swelling and redness.  Patient states she started noticing increasing redness and swelling primarily in her right leg few days ago.  She has also developed these large blisters that are weeping.  Patient states she was out of clinic this morning and was sent to the ED for further evaluation.  She denies any fevers or chills.  She denies any severe pain  Home Medications Prior to Admission medications   Medication Sig Start Date End Date Taking? Authorizing Provider  mupirocin ointment (BACTROBAN) 2 % Apply topically. 07/22/23  Yes [provider]  potassium chloride (KLOR-CON) 10 MEQ tablet Take 1 tablet by mouth daily for 5 days. 12/20/21 12/25/21  Ernie Avena, MD      Allergies    Penicillins    Review of Systems   Review of Systems  Physical Exam Updated Vital Signs BP (!) 157/88 (BP Location: Left Arm)   Pulse 66   Temp 97.7 F (36.5 C) (Oral)   Resp 16   Ht 1.753 m (5\' 9" )   Wt 86.6 kg   SpO2 100%   BMI 28.19 kg/m  Physical Exam Vitals and nursing note reviewed.  Constitutional:      Appearance: She is well-developed. She is not diaphoretic.  HENT:     Head: Normocephalic and atraumatic.     Right Ear: External ear normal.     Left Ear: External ear normal.  Eyes:     General: No scleral icterus.       Right eye: No discharge.        Left eye: No discharge.     Conjunctiva/sclera: Conjunctivae normal.  Neck:     Trachea: No tracheal deviation.  Cardiovascular:     Rate and Rhythm: Normal rate.  Pulmonary:     Effort: Pulmonary effort is normal. No respiratory distress.     Breath sounds: No stridor.  Abdominal:      General: There is no distension.  Musculoskeletal:        General: Tenderness present. No swelling or deformity.     Cervical back: Neck supple.     Right lower leg: Edema present.     Left lower leg: Edema present.     Comments: Chronic lymphedema noted bilateral lower extremities, thickened skin, erythema noted to right lower extremity above and below the knee, more on the lateral aspect, large bullous lesions noted, no crepitus  Skin:    General: Skin is warm and dry.     Findings: No rash.  Neurological:     Mental Status: She is alert. Mental status is at baseline.     Cranial Nerves: No dysarthria or facial asymmetry.     Motor: No seizure activity.     ED Results / Procedures / Treatments   Labs (all labs ordered are listed, but only abnormal results are displayed) Labs Reviewed  CBC - Abnormal; Notable for the following components:      Result Value   RBC 3.70 (*)    All other components within normal limits  BASIC METABOLIC PANEL - Abnormal; Notable for the following components:   Potassium 3.3 (*)  Glucose, Bld 100 (*)    All other components within normal limits  BRAIN NATRIURETIC PEPTIDE - Abnormal; Notable for the following components:   B Natriuretic Peptide 130.0 (*)    All other components within normal limits  I-STAT CG4 LACTIC ACID, ED  I-STAT CG4 LACTIC ACID, ED    EKG None  Radiology DG Chest 2 View  Result Date: 08/09/2023 CLINICAL DATA:  Bilateral lower extremity edema. EXAM: CHEST - 2 VIEW COMPARISON:  07/02/2021. FINDINGS: There are mildly increased interstitial markings when compared to the prior exam. Bilateral lung fields are otherwise clear. No acute consolidation or lung collapse. Bilateral costophrenic angles are clear. No pneumothorax. Normal cardio-mediastinal silhouette. There is medium-sized retrocardiac hiatal hernia. No acute osseous abnormalities. There is a stable sclerotic focus in the posterior left fifth rib, indeterminate but  unchanged since multiple prior studies. The soft tissues are within normal limits. IMPRESSION: *Mildly increased bilateral interstitial markings may represent mild pulmonary edema. Otherwise unremarkable exam. Electronically Signed   By: Jules Schick M.D.   On: 08/09/2023 12:39    Procedures Procedures    Medications Ordered in ED Medications  cephALEXin (KEFLEX) capsule 500 mg (has no administration in time range)    ED Course/ Medical Decision Making/ A&P Clinical Course as of 08/09/23 1310  Fri Aug 09, 2023  1253 CBC normal metabolic panel shows slight decrease in potassium at 3.3.  Lactic acid level not elevated.  BNP normal. [JK]  1254 Chest x-ray suggest mild increased interstitial markings [JK]    Clinical Course User Index [JK] Linwood Dibbles, MD                                 Medical Decision Making Problems Addressed: Cellulitis of right lower extremity: acute illness or injury that poses a threat to life or bodily functions Lymphedema: acute illness or injury that poses a threat to life or bodily functions  Amount and/or Complexity of Data Reviewed Labs: ordered. Decision-making details documented in ED Course. Radiology: ordered and independent interpretation performed.  Risk Prescription drug management.   Patient presented to the ED with complaints of increasing leg swelling bulla formation.  Patient noted to have chronic lymphedema.  She also has evidence of erythema in her right leg associated with the bulla.  Suspect cellulitis.  Patient does not have any fever.  No leukocytosis.  No signs of systemic infection.  Patient would like to try outpatient management.  Will try her on a course of antibiotics.  Also has chronic lymphedema will apply sterile dressing with Ace wrap compression.  Recommend outpatient follow-up with PCP and the wound care center for further treatment.        Final Clinical Impression(s) / ED Diagnoses Final diagnoses:  Cellulitis of  right lower extremity  Lymphedema    Rx / DC Orders ED Discharge Orders     None         Linwood Dibbles, MD 08/09/23 1310 I was notified that patient was found on the ground as they were helping her out of the ED.  Patient states she slipped.  She denies any injuries.  Patient still wants to go home.  Does not want any additional evaluation.  She is asking if I can call the driver so she can get picked up to go home.  Patient still appears alert and oriented no signs of head injury or obvious external injuries.   Linwood Dibbles,  MD 08/09/23 1401

## 2023-08-09 NOTE — ED Triage Notes (Signed)
Pt biba from MD office. BLE edema to lower extremities, chronic. However, has blisters that appeared two days ago but just popped this morning. Legs are weeping, foul odor. MD sent patient over to ED for evaluation. Patient AAOx4, ambulates with walker at baseline. NAD noted

## 2023-08-09 NOTE — ED Notes (Signed)
Patient was in a rush to leave    she refused to left me wrap her legs   she stated she would take the ace bandages home with her and do them herself   she turned around and slipped and fell down    she denies any issues
